# Patient Record
Sex: Male | Born: 1975 | Race: Black or African American | Hispanic: No | Marital: Married | Smoking: Current every day smoker
Health system: Southern US, Community
[De-identification: ages and names within clinical notes are randomized; demographics above are authoritative.]

---

## 2003-03-11 ENCOUNTER — Other Ambulatory Visit: Payer: Self-pay

## 2005-12-08 ENCOUNTER — Emergency Department: Payer: Self-pay | Admitting: Emergency Medicine

## 2006-03-30 ENCOUNTER — Emergency Department: Payer: Self-pay | Admitting: Emergency Medicine

## 2008-01-26 ENCOUNTER — Emergency Department: Payer: Self-pay

## 2010-04-25 ENCOUNTER — Emergency Department (HOSPITAL_COMMUNITY)
Admission: EM | Admit: 2010-04-25 | Discharge: 2010-04-26 | Disposition: A | Discharge status: Home / Self Care | Payer: BC Managed Care – PPO | Attending: Emergency Medicine | Admitting: Emergency Medicine

## 2010-04-25 DIAGNOSIS — R030 Elevated blood-pressure reading, without diagnosis of hypertension: Secondary | ICD-10-CM | POA: Insufficient documentation

## 2010-04-25 DIAGNOSIS — K089 Disorder of teeth and supporting structures, unspecified: Secondary | ICD-10-CM | POA: Insufficient documentation

## 2010-04-25 DIAGNOSIS — J329 Chronic sinusitis, unspecified: Secondary | ICD-10-CM | POA: Insufficient documentation

## 2010-04-25 DIAGNOSIS — J3489 Other specified disorders of nose and nasal sinuses: Secondary | ICD-10-CM | POA: Insufficient documentation

## 2010-04-25 DIAGNOSIS — R220 Localized swelling, mass and lump, head: Secondary | ICD-10-CM | POA: Insufficient documentation

## 2010-04-25 DIAGNOSIS — R221 Localized swelling, mass and lump, neck: Secondary | ICD-10-CM | POA: Insufficient documentation

## 2013-09-09 ENCOUNTER — Emergency Department: Payer: Self-pay | Admitting: Emergency Medicine

## 2013-10-12 ENCOUNTER — Other Ambulatory Visit: Payer: Self-pay | Admitting: Internal Medicine

## 2013-10-19 ENCOUNTER — Ambulatory Visit: Payer: Self-pay

## 2013-11-09 ENCOUNTER — Other Ambulatory Visit: Payer: Self-pay | Admitting: Neurological Surgery

## 2013-11-09 DIAGNOSIS — M545 Low back pain: Principal | ICD-10-CM

## 2013-11-09 DIAGNOSIS — G8929 Other chronic pain: Secondary | ICD-10-CM

## 2013-11-14 ENCOUNTER — Ambulatory Visit
Admission: RE | Admit: 2013-11-14 | Discharge: 2013-11-14 | Disposition: A | Discharge status: Home / Self Care | Payer: BC Managed Care – PPO | Source: Ambulatory Visit | Attending: Neurological Surgery | Admitting: Neurological Surgery

## 2013-11-14 DIAGNOSIS — G8929 Other chronic pain: Secondary | ICD-10-CM

## 2013-11-14 DIAGNOSIS — M545 Low back pain: Principal | ICD-10-CM

## 2013-11-14 MED ORDER — METHYLPREDNISOLONE ACETATE 40 MG/ML INJ SUSP (RADIOLOG
120.0000 mg | Freq: Once | INTRAMUSCULAR | Status: AC
  Administered 2013-11-14: 120 mg via EPIDURAL

## 2013-11-14 MED ORDER — IOHEXOL 180 MG/ML  SOLN
1.0000 mL | Freq: Once | INTRAMUSCULAR | Status: AC | PRN
  Administered 2013-11-14: 1 mL via EPIDURAL

## 2013-11-14 NOTE — Discharge Instructions (Signed)

## 2013-12-05 ENCOUNTER — Other Ambulatory Visit: Payer: Self-pay | Admitting: Neurological Surgery

## 2013-12-05 DIAGNOSIS — M5127 Other intervertebral disc displacement, lumbosacral region: Secondary | ICD-10-CM

## 2013-12-10 DIAGNOSIS — I10 Essential (primary) hypertension: Secondary | ICD-10-CM | POA: Insufficient documentation

## 2013-12-10 DIAGNOSIS — K219 Gastro-esophageal reflux disease without esophagitis: Secondary | ICD-10-CM | POA: Insufficient documentation

## 2013-12-11 ENCOUNTER — Ambulatory Visit
Admission: RE | Admit: 2013-12-11 | Discharge: 2013-12-11 | Disposition: A | Discharge status: Home / Self Care | Payer: BC Managed Care – PPO | Source: Ambulatory Visit | Attending: Neurological Surgery | Admitting: Neurological Surgery

## 2013-12-11 VITALS — BP 162/100 | HR 65 | Wt 325.0 lb

## 2013-12-11 DIAGNOSIS — M5127 Other intervertebral disc displacement, lumbosacral region: Secondary | ICD-10-CM

## 2013-12-11 MED ORDER — METHYLPREDNISOLONE ACETATE 40 MG/ML INJ SUSP (RADIOLOG
120.0000 mg | Freq: Once | INTRAMUSCULAR | Status: AC
  Administered 2013-12-11: 120 mg via EPIDURAL

## 2013-12-11 MED ORDER — IOHEXOL 180 MG/ML  SOLN
1.0000 mL | Freq: Once | INTRAMUSCULAR | Status: AC | PRN
  Administered 2013-12-11: 1 mL via EPIDURAL

## 2013-12-11 NOTE — Discharge Instructions (Signed)

## 2014-07-01 ENCOUNTER — Ambulatory Visit
Admit: 2014-07-01 | Disposition: A | Discharge status: Home / Self Care | Payer: Self-pay | Attending: Physician Assistant | Admitting: Physician Assistant

## 2014-07-15 ENCOUNTER — Other Ambulatory Visit: Payer: Self-pay | Admitting: Physician Assistant

## 2014-07-15 DIAGNOSIS — R1084 Generalized abdominal pain: Secondary | ICD-10-CM

## 2014-07-16 ENCOUNTER — Ambulatory Visit
Admission: RE | Admit: 2014-07-16 | Discharge: 2014-07-16 | Disposition: A | Discharge status: Home / Self Care | Payer: BLUE CROSS/BLUE SHIELD | Source: Ambulatory Visit | Attending: Physician Assistant | Admitting: Physician Assistant

## 2014-07-16 DIAGNOSIS — R1084 Generalized abdominal pain: Secondary | ICD-10-CM | POA: Diagnosis present

## 2014-07-16 MED ORDER — SINCALIDE 5 MCG IJ SOLR
0.0200 ug/kg | Freq: Once | INTRAMUSCULAR | Status: AC
  Administered 2014-07-16: 2.95 ug via INTRAVENOUS

## 2014-07-16 MED ORDER — TECHNETIUM TC 99M MEBROFENIN IV KIT
5.2700 | PACK | Freq: Once | INTRAVENOUS | Status: AC | PRN
  Administered 2014-07-16: 5.27 via INTRAVENOUS

## 2015-05-01 ENCOUNTER — Ambulatory Visit: Payer: BLUE CROSS/BLUE SHIELD | Attending: Internal Medicine

## 2016-05-11 ENCOUNTER — Ambulatory Visit: Payer: BLUE CROSS/BLUE SHIELD | Attending: Internal Medicine

## 2017-02-01 IMAGING — US ABDOMEN ULTRASOUND
1 series · 14 of 25 positions shown · non-contrast
Comparison: None.

CLINICAL DATA: Three weeks of generalized abdominal pain with
cramping and diarrhea

EXAM:
ULTRASOUND ABDOMEN COMPLETE

[Series 1: abdomen ultrasound · 0.22mm/px · 14 of 191 slices shown]
[im 1/191]
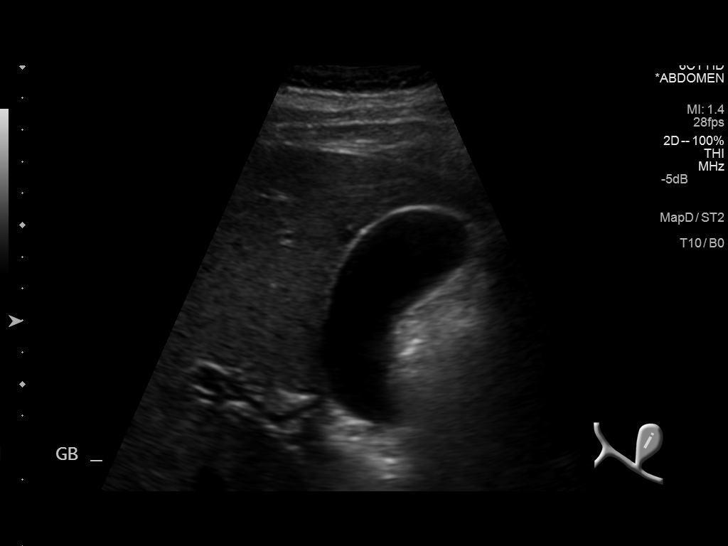
[im 16/191]
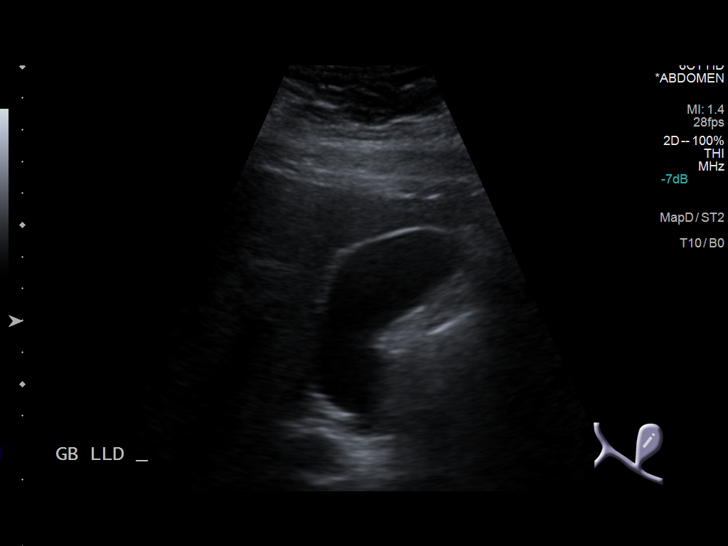
[im 32/191]
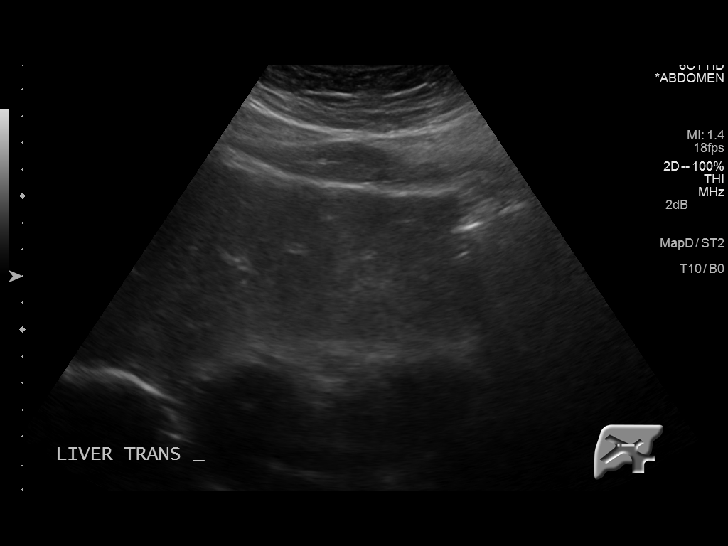
[im 48/191]
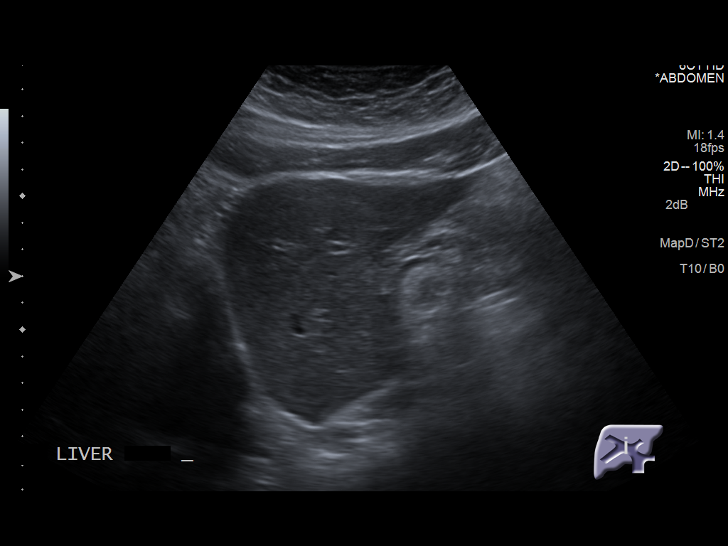
[im 64/191]
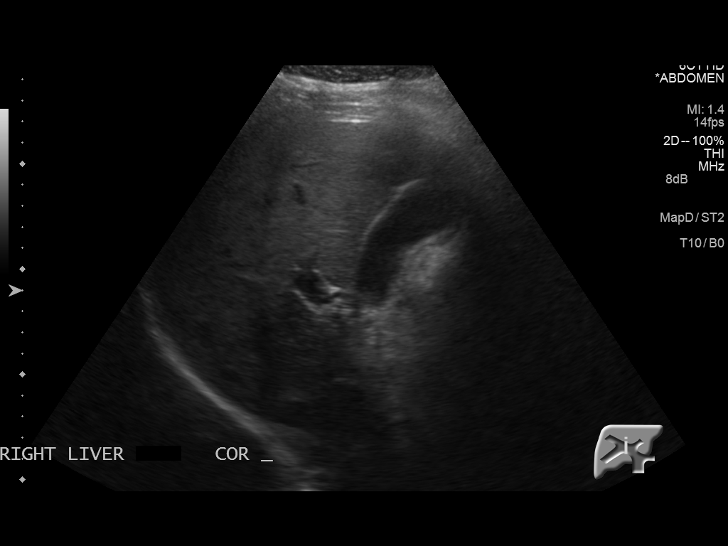
[im 72/191]
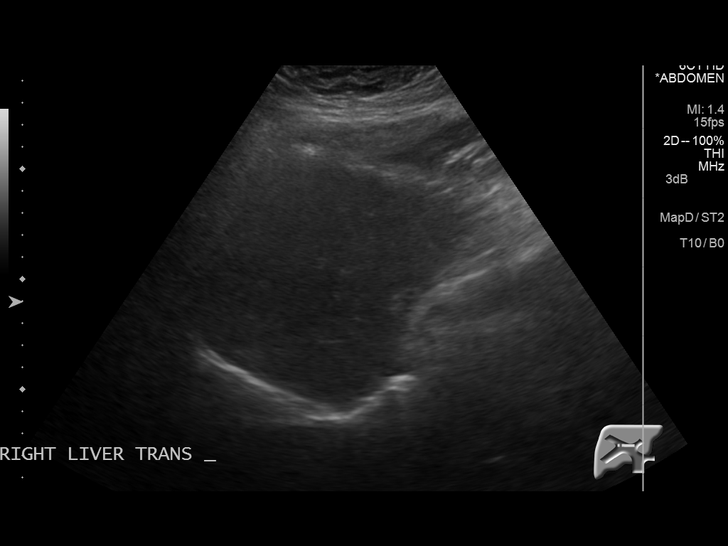
[im 88/191]
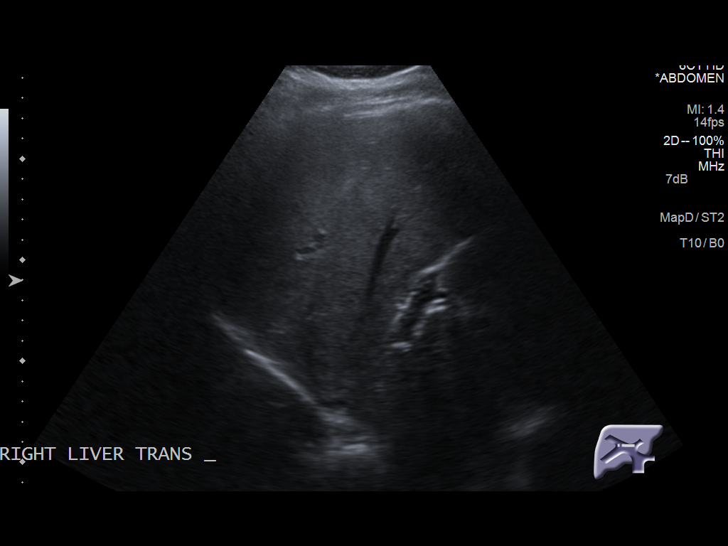
[im 103/191]
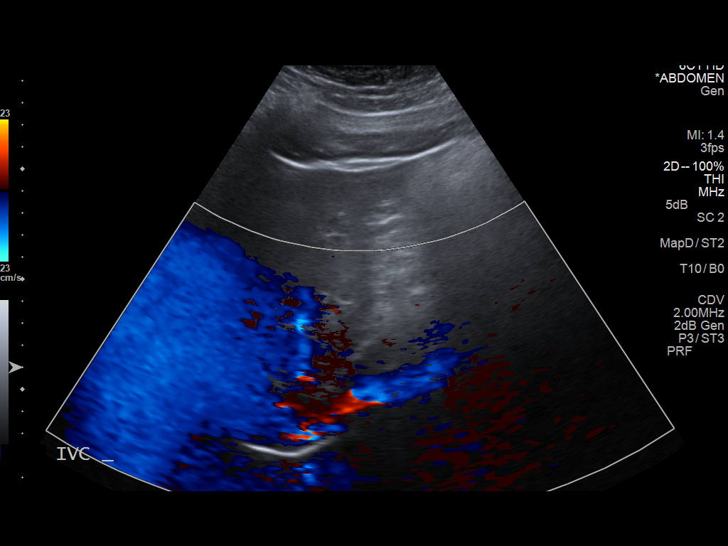
[im 119/191]
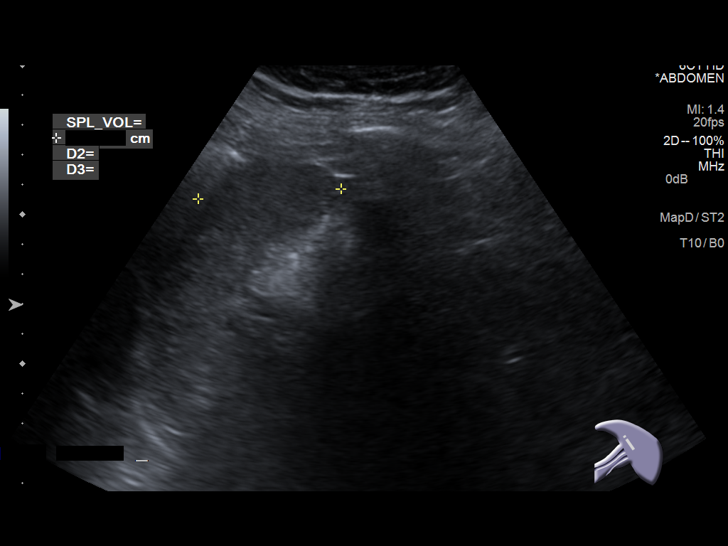
[im 127/191]
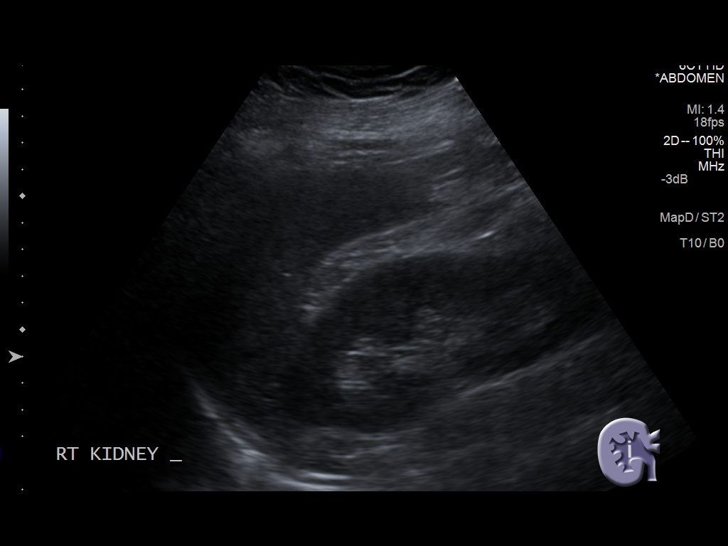
[im 143/191]
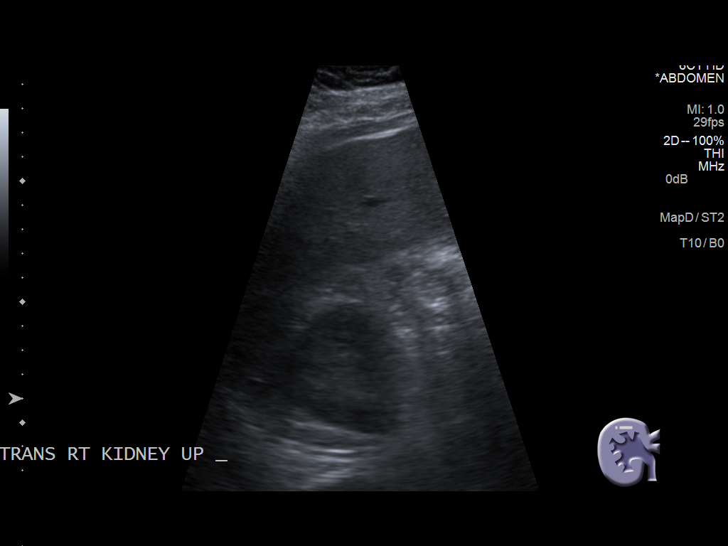
[im 159/191]
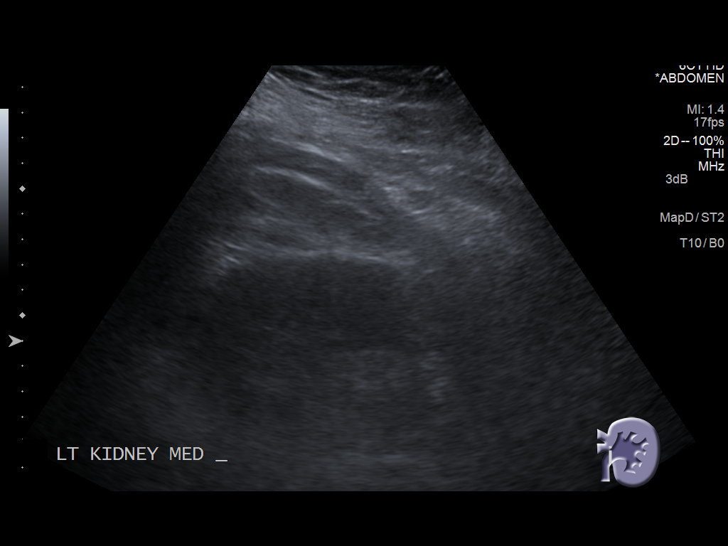
[im 175/191]
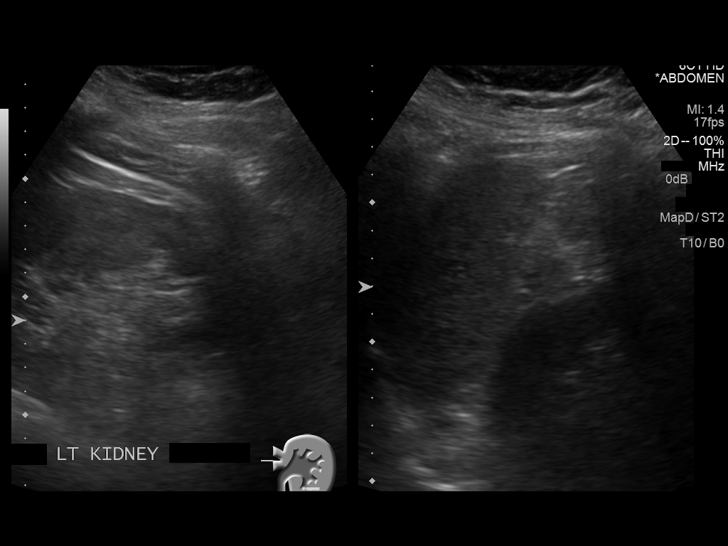
[im 191/191]
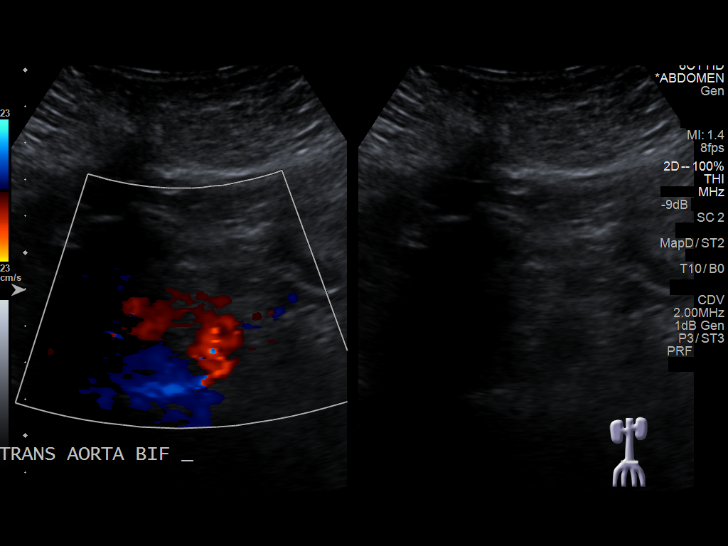

[14 of 25 positions shown; findings below may reference images not displayed]

FINDINGS: Gallbladder: No gallstones or wall thickening visualized. No
sonographic Murphy sign noted.

Common bile duct: Diameter: 2.8 mm

Liver: No focal lesion identified. Within normal limits in
parenchymal echogenicity.

IVC: No abnormality visualized.

Pancreas: Bowel gas limits evaluation of the pancreas

Spleen: Size and appearance within normal limits.

Right Kidney: Length: 12.8 cm. Echogenicity within normal limits. No
mass or hydronephrosis visualized.

Left Kidney: Length: 13.4 cm. Echogenicity within normal limits. No
mass or hydronephrosis visualized.

Abdominal aorta: No aneurysm visualized.

Other findings: There is no ascites.
IMPRESSION: No gallstones. No acute hepatobiliary abnormality is demonstrated.
Evaluation of the pancreas is limited. If gallbladder pathology
remains suspected, a nuclear medicine hepatobiliary scan with
gallbladder ejection fraction may be useful.

## 2017-02-16 IMAGING — NM NM HEPATO W/GB/PHARM/[PERSON_NAME]
2 series · 12 of 12 positions shown · non-contrast
Comparison: None.

CLINICAL DATA: Abdominal pain.

EXAM:
NUCLEAR MEDICINE HEPATOBILIARY IMAGING WITH GALLBLADDER EF
TECHNIQUE: Sequential images of the abdomen were obtained [DATE] minutes
following intravenous administration of radiopharmaceutical. After
slow intravenous infusion of Cholecystokinin, gallbladder ejection
fraction was determined.
RADIOPHARMACEUTICALS:  2 mCi Xechnetium-99m Choletec IV

[Series 1000: gallbladder ef · 4.80mm/px · 6 of 90 frames shown]
[frame 8/90]
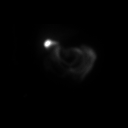
[frame 23/90]
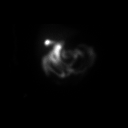
[frame 38/90]
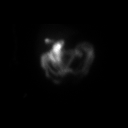
[frame 53/90]
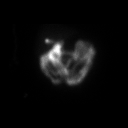
[frame 68/90]
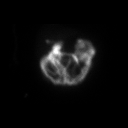
[frame 83/90]
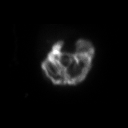

[Series 1000: hepatobiliary scan · 9.59mm/px · 6 of 60 frames shown]
[frame 6/60]
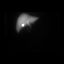
[frame 16/60]
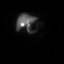
[frame 26/60]
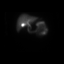
[frame 36/60]
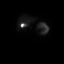
[frame 46/60]
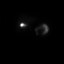
[frame 56/60]
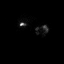

[12 of 12 positions shown; findings below may reference images not displayed]

FINDINGS: Liver, biliary system, gallbladder, bowel visualize normally.
Gallbladder ejection fraction at 45 minutes is 97%. This is normal..
At 45 min, normal ejection fraction is greater than 40%.
IMPRESSION: Normal exam.
